# Patient Record
Sex: Male | Born: 1945 | Race: Black or African American | Hispanic: No | Marital: Married | Smoking: Current every day smoker
Health system: Southern US, Community
[De-identification: ages and names within clinical notes are randomized; demographics above are authoritative.]

## PROBLEM LIST (undated history)

## (undated) DIAGNOSIS — I10 Essential (primary) hypertension: Secondary | ICD-10-CM

## (undated) HISTORY — PX: PENILE PROSTHESIS IMPLANT: SHX240

## (undated) HISTORY — PX: PACEMAKER INSERTION: SHX728

---

## 2009-12-24 ENCOUNTER — Ambulatory Visit: Payer: Self-pay | Admitting: Diagnostic Radiology

## 2009-12-24 ENCOUNTER — Emergency Department (HOSPITAL_BASED_OUTPATIENT_CLINIC_OR_DEPARTMENT_OTHER): Admission: EM | Admit: 2009-12-24 | Discharge: 2009-12-24 | Payer: Self-pay | Admitting: Emergency Medicine

## 2010-06-23 LAB — CBC
HCT: 40.8 % (ref 39.0–52.0)
Hemoglobin: 13.8 g/dL (ref 13.0–17.0)
MCH: 33.5 pg (ref 26.0–34.0)
MCHC: 33.9 g/dL (ref 30.0–36.0)
MCV: 99 fL (ref 78.0–100.0)
Platelets: 280 10*3/uL (ref 150–400)
RBC: 4.13 MIL/uL — ABNORMAL LOW (ref 4.22–5.81)
RDW: 13.7 % (ref 11.5–15.5)
WBC: 4.8 10*3/uL (ref 4.0–10.5)

## 2010-06-23 LAB — BASIC METABOLIC PANEL WITH GFR
BUN: 13 mg/dL (ref 6–23)
CO2: 25 meq/L (ref 19–32)
Calcium: 9.5 mg/dL (ref 8.4–10.5)
Chloride: 110 meq/L (ref 96–112)
Creatinine, Ser: 1.5 mg/dL (ref 0.4–1.5)
GFR calc non Af Amer: 47 mL/min — ABNORMAL LOW
Glucose, Bld: 98 mg/dL (ref 70–99)
Potassium: 4.7 meq/L (ref 3.5–5.1)
Sodium: 143 meq/L (ref 135–145)

## 2010-06-23 LAB — URINALYSIS, ROUTINE W REFLEX MICROSCOPIC
Bilirubin Urine: NEGATIVE
Glucose, UA: NEGATIVE mg/dL
Hgb urine dipstick: NEGATIVE
Ketones, ur: NEGATIVE mg/dL
Nitrite: NEGATIVE
Protein, ur: NEGATIVE mg/dL
Specific Gravity, Urine: 1.016 (ref 1.005–1.030)
Urobilinogen, UA: 1 mg/dL (ref 0.0–1.0)
pH: 6 (ref 5.0–8.0)

## 2010-06-23 LAB — PROTIME-INR
INR: 0.95 (ref 0.00–1.49)
Prothrombin Time: 12.9 s (ref 11.6–15.2)

## 2010-06-23 LAB — URINE CULTURE
Colony Count: NO GROWTH
Culture  Setup Time: 201109170627
Culture: NO GROWTH

## 2010-06-23 LAB — DIFFERENTIAL
Eosinophils Absolute: 0.1 10*3/uL (ref 0.0–0.7)
Lymphocytes Relative: 27 % (ref 12–46)
Lymphs Abs: 1.3 10*3/uL (ref 0.7–4.0)
Monocytes Relative: 9 % (ref 3–12)
Neutro Abs: 2.9 10*3/uL (ref 1.7–7.7)
Neutrophils Relative %: 62 % (ref 43–77)

## 2010-06-23 LAB — POCT CARDIAC MARKERS
CKMB, poc: <1 ng/mL — ABNORMAL LOW (ref 1.0–8.0)
Myoglobin, poc: 49.9 ng/mL (ref 12–200)
Troponin i, poc: <0.05 ng/mL (ref 0.00–0.09)

## 2014-08-15 ENCOUNTER — Emergency Department (HOSPITAL_BASED_OUTPATIENT_CLINIC_OR_DEPARTMENT_OTHER)
Admission: EM | Admit: 2014-08-15 | Discharge: 2014-08-15 | Disposition: A | Discharge status: Home / Self Care | Payer: Medicare Other | Attending: Emergency Medicine | Admitting: Emergency Medicine

## 2014-08-15 ENCOUNTER — Encounter (HOSPITAL_BASED_OUTPATIENT_CLINIC_OR_DEPARTMENT_OTHER): Payer: Self-pay

## 2014-08-15 DIAGNOSIS — Y92410 Unspecified street and highway as the place of occurrence of the external cause: Secondary | ICD-10-CM | POA: Diagnosis not present

## 2014-08-15 DIAGNOSIS — Z79899 Other long term (current) drug therapy: Secondary | ICD-10-CM | POA: Insufficient documentation

## 2014-08-15 DIAGNOSIS — S161XXA Strain of muscle, fascia and tendon at neck level, initial encounter: Secondary | ICD-10-CM | POA: Insufficient documentation

## 2014-08-15 DIAGNOSIS — T148XXA Other injury of unspecified body region, initial encounter: Secondary | ICD-10-CM

## 2014-08-15 DIAGNOSIS — S46911A Strain of unspecified muscle, fascia and tendon at shoulder and upper arm level, right arm, initial encounter: Secondary | ICD-10-CM | POA: Insufficient documentation

## 2014-08-15 DIAGNOSIS — Y998 Other external cause status: Secondary | ICD-10-CM | POA: Insufficient documentation

## 2014-08-15 DIAGNOSIS — Y9389 Activity, other specified: Secondary | ICD-10-CM | POA: Insufficient documentation

## 2014-08-15 DIAGNOSIS — I10 Essential (primary) hypertension: Secondary | ICD-10-CM | POA: Diagnosis not present

## 2014-08-15 DIAGNOSIS — S199XXA Unspecified injury of neck, initial encounter: Secondary | ICD-10-CM | POA: Diagnosis present

## 2014-08-15 HISTORY — DX: Essential (primary) hypertension: I10

## 2014-08-15 MED ORDER — DIAZEPAM 5 MG PO TABS: 2.5000 mg | ORAL_TABLET | Freq: Four times a day (QID) | ORAL | Status: AC | PRN

## 2014-08-15 MED ORDER — TRAMADOL HCL 50 MG PO TABS: 50.0000 mg | ORAL_TABLET | Freq: Four times a day (QID) | ORAL | Status: AC | PRN

## 2014-08-15 MED ORDER — OXYCODONE-ACETAMINOPHEN 5-325 MG PO TABS
1.0000 | ORAL_TABLET | Freq: Once | ORAL | Status: AC
  Administered 2014-08-15: 1 via ORAL
  Filled 2014-08-15: qty 1

## 2014-08-15 MED ORDER — DIAZEPAM 5 MG PO TABS
5.0000 mg | ORAL_TABLET | Freq: Once | ORAL | Status: AC
  Administered 2014-08-15: 5 mg via ORAL
  Filled 2014-08-15: qty 1
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
## 2021-07-01 ENCOUNTER — Emergency Department (HOSPITAL_BASED_OUTPATIENT_CLINIC_OR_DEPARTMENT_OTHER)
Admission: EM | Admit: 2021-07-01 | Discharge: 2021-07-01 | Disposition: A | Discharge status: Home / Self Care | Payer: Medicare Other | Attending: Emergency Medicine | Admitting: Emergency Medicine

## 2021-07-01 ENCOUNTER — Other Ambulatory Visit: Payer: Self-pay

## 2021-07-01 ENCOUNTER — Encounter (HOSPITAL_BASED_OUTPATIENT_CLINIC_OR_DEPARTMENT_OTHER): Payer: Self-pay

## 2021-07-01 ENCOUNTER — Emergency Department (HOSPITAL_BASED_OUTPATIENT_CLINIC_OR_DEPARTMENT_OTHER): Payer: Medicare Other

## 2021-07-01 DIAGNOSIS — M545 Low back pain, unspecified: Secondary | ICD-10-CM | POA: Insufficient documentation

## 2021-07-01 DIAGNOSIS — M549 Dorsalgia, unspecified: Secondary | ICD-10-CM

## 2021-07-01 DIAGNOSIS — M546 Pain in thoracic spine: Secondary | ICD-10-CM | POA: Insufficient documentation

## 2021-07-01 DIAGNOSIS — M542 Cervicalgia: Secondary | ICD-10-CM | POA: Insufficient documentation

## 2021-07-01 DIAGNOSIS — Y92410 Unspecified street and highway as the place of occurrence of the external cause: Secondary | ICD-10-CM | POA: Diagnosis not present

## 2021-07-01 DIAGNOSIS — R519 Headache, unspecified: Secondary | ICD-10-CM | POA: Insufficient documentation
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
## 2021-07-01 NOTE — Discharge Instructions (Addendum)
Your imaging of your head, neck, back were all negative and without fracture.  Continue taking Tylenol, Motrin, and Flexeril for pain control.  Follow-up with your PCP. ?
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
## 2023-10-28 IMAGING — CT CT T SPINE W/O CM
3 of 4 series · 10 of 33 positions shown, 12 images · non-contrast
Comparison: None.

CLINICAL DATA: Mid back pain

EXAM:
CT THORACIC SPINE WITHOUT CONTRAST
TECHNIQUE: Multidetector CT images of the thoracic were obtained using the
standard protocol without intravenous contrast.
RADIATION DOSE REDUCTION: This exam was performed according to the
departmental dose-optimization program which includes automated
exposure control, adjustment of the mA and/or kV according to
patient size and/or use of iterative reconstruction technique.

[Series 5: sagittal bone · sagittal · 0.27mm/px · 5 of 61 slices shown, 6 images]
[im 21/61  bone]
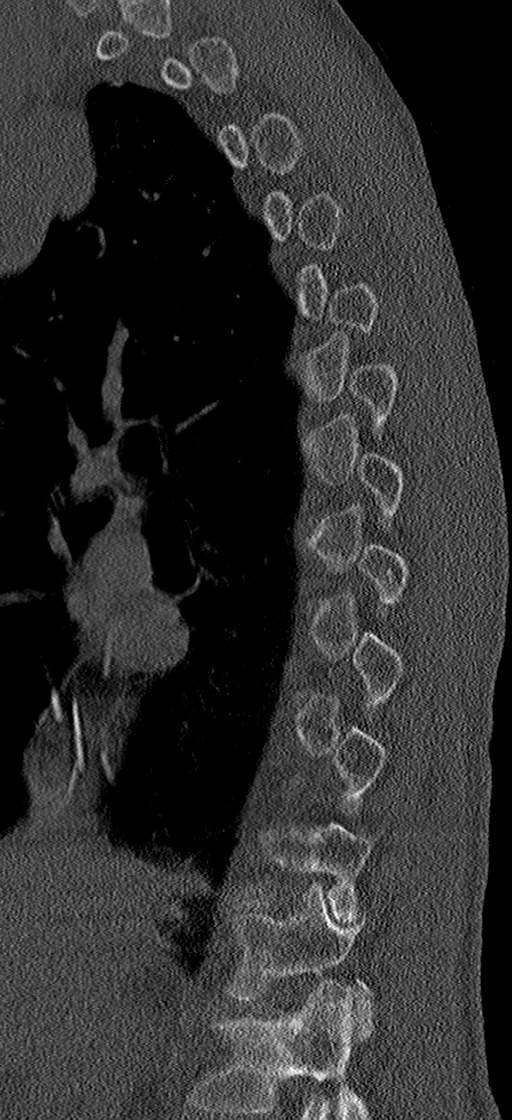
[im 26/61  bone]
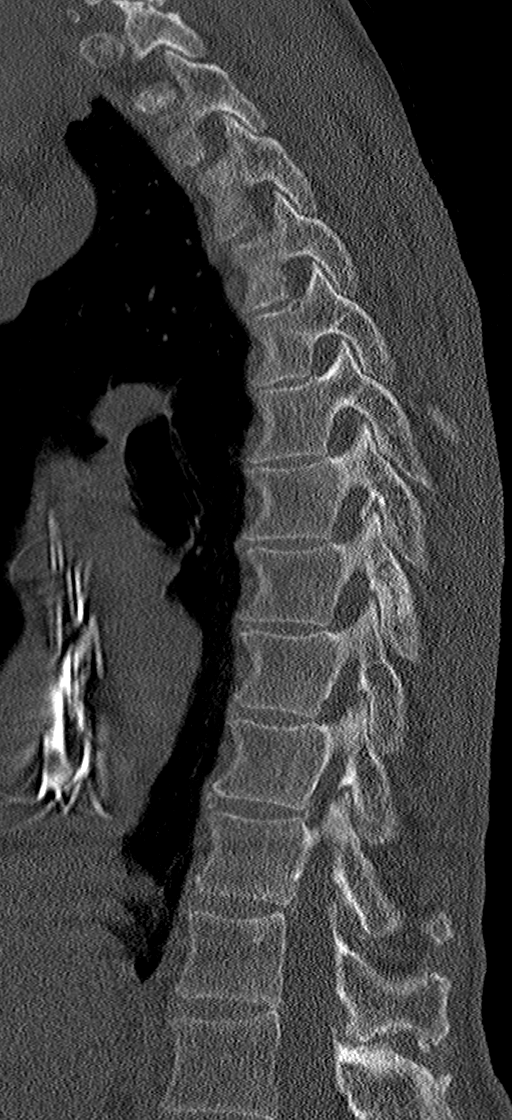
[im 31/61  soft-tissue]
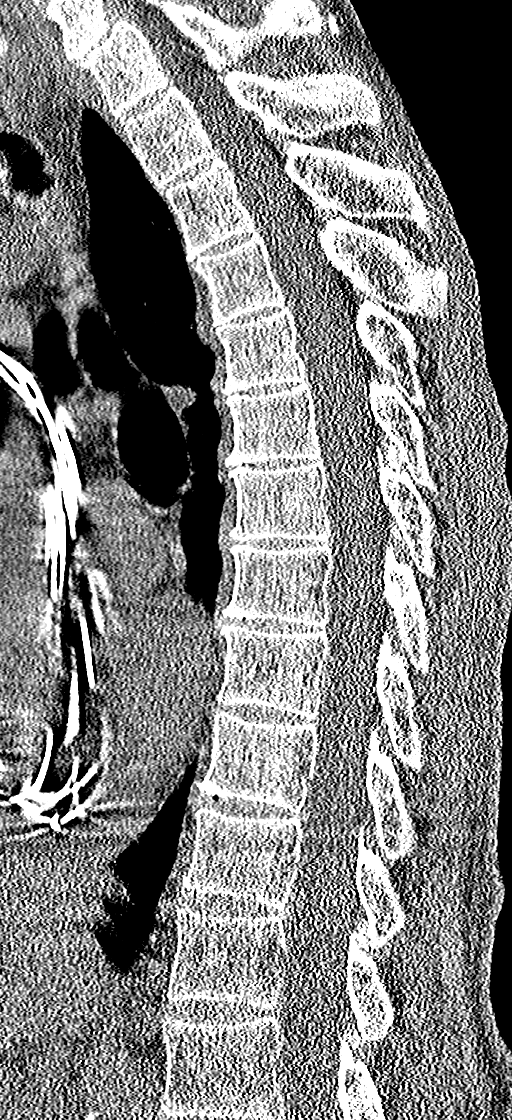
[im 31/61  bone]
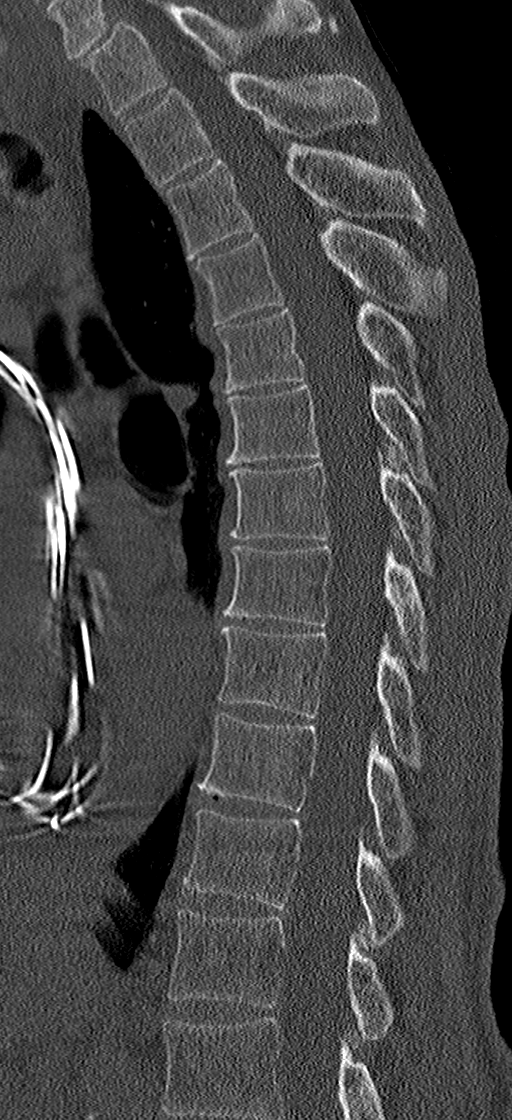
[im 36/61  bone]
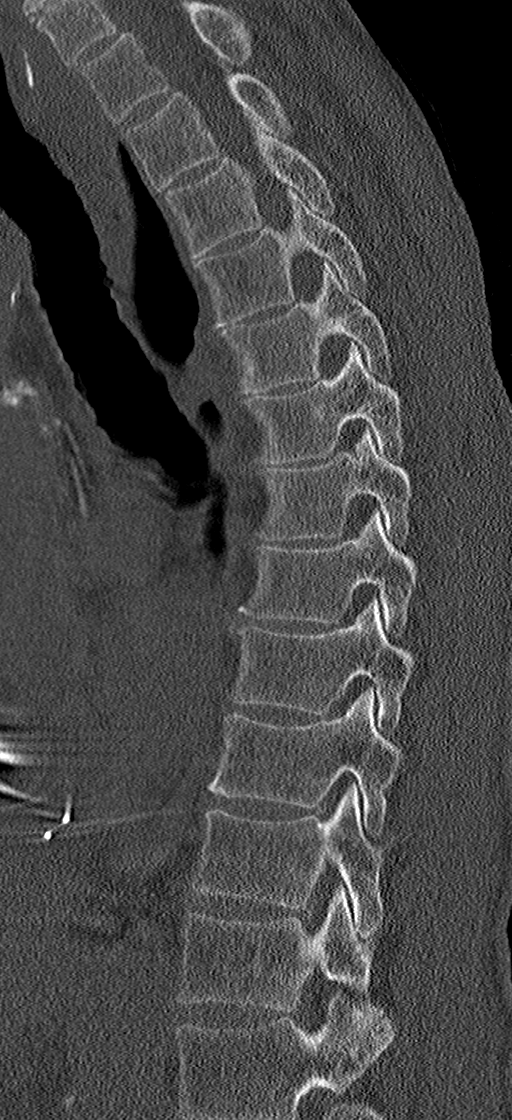
[im 41/61  bone]
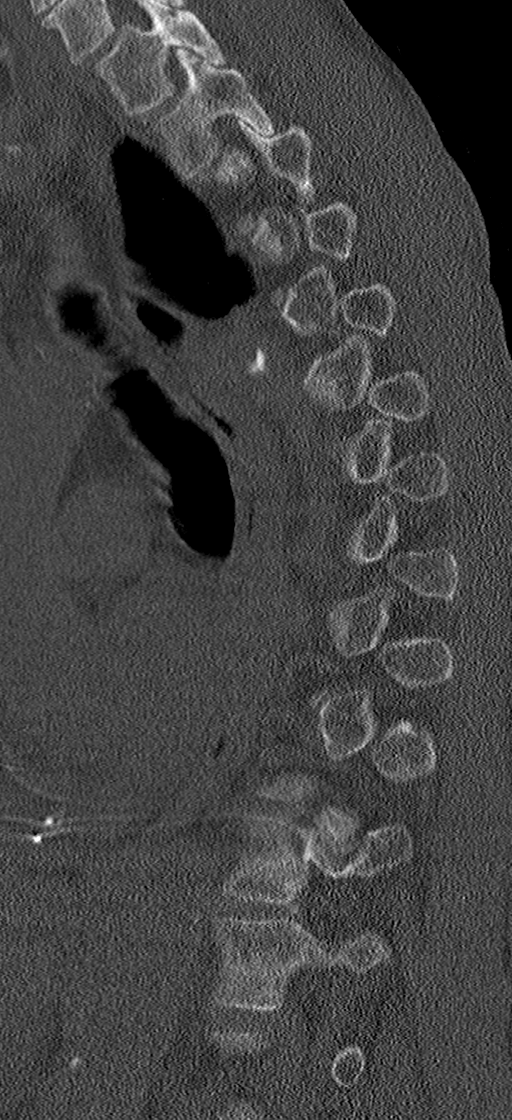

[Series 6: coronal bone · coronal · 0.45mm/px · 3 of 61 slices shown]
[im 13/61  bone]
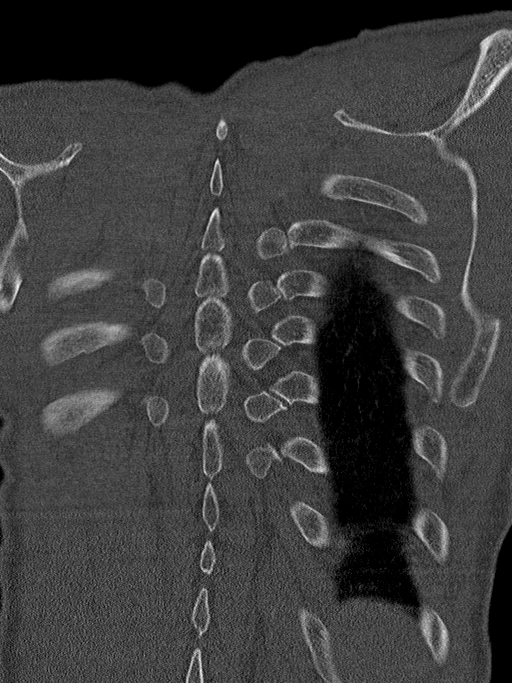
[im 25/61  bone]
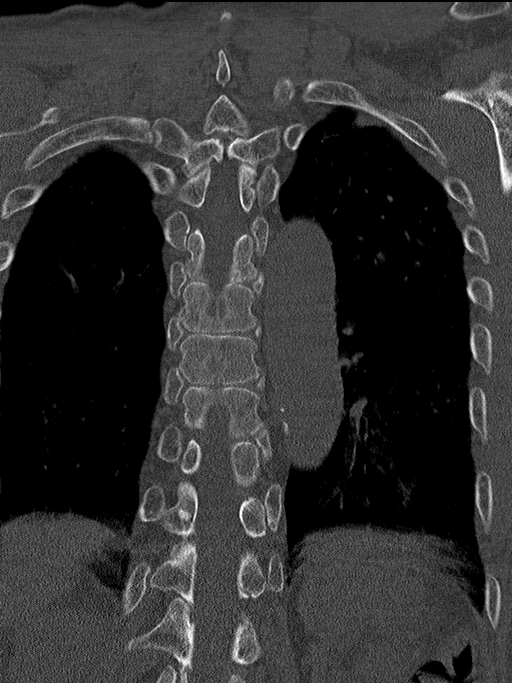
[im 37/61  bone]
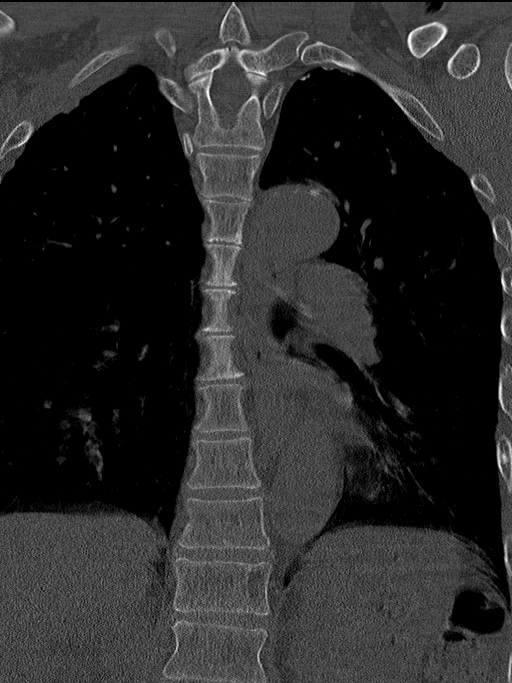

[Series 8: orthogonal axials · axial · 0.23mm/px · z∈[-116,-38]mm · 2 of 81 slices shown, 3 images]
[im 21/81  soft-tissue]
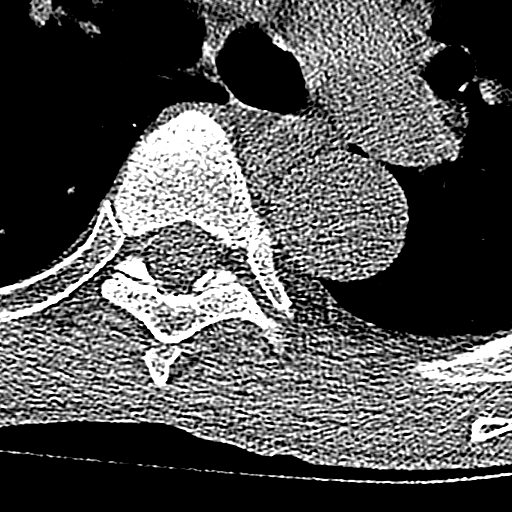
[im 21/81  bone]
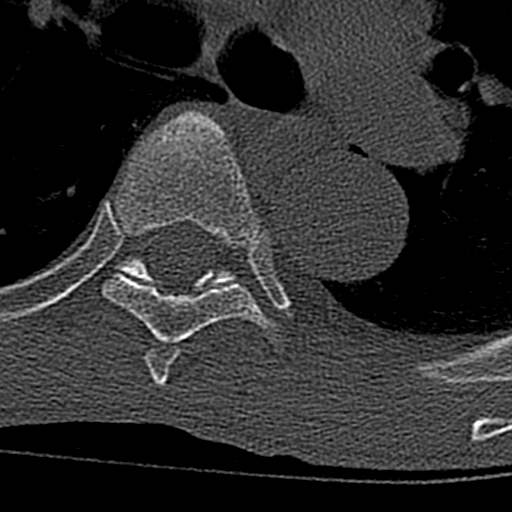
[im 61/81  bone]
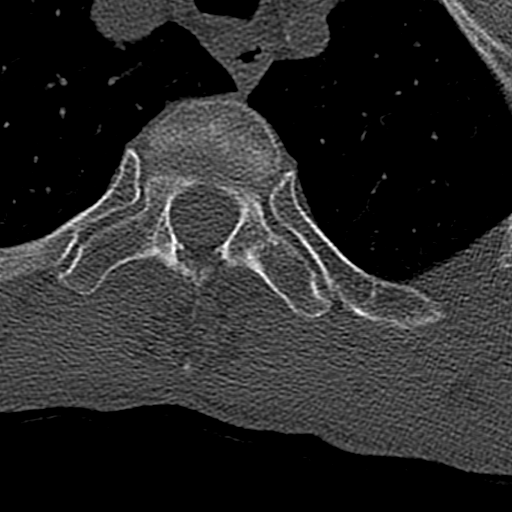

[10 of 33 positions shown; findings below may reference images not displayed]

FINDINGS: Alignment: Normal.

Vertebrae: No acute fracture or focal pathologic process.

Paraspinal and other soft tissues: Calcific aortic atherosclerosis.

Disc levels: No spinal canal stenosis.
IMPRESSION: 1. No acute fracture or static subluxation of the thoracic spine.

Aortic Atherosclerosis ([id number].P).
